# Patient Record
Sex: Male | Born: 1965 | Race: White | Hispanic: No | Marital: Married | Smoking: Current every day smoker
Health system: Southern US, Community
[De-identification: ages and names within clinical notes are randomized; demographics above are authoritative.]

---

## 2018-04-28 ENCOUNTER — Emergency Department (HOSPITAL_BASED_OUTPATIENT_CLINIC_OR_DEPARTMENT_OTHER): Payer: Self-pay

## 2018-04-28 ENCOUNTER — Other Ambulatory Visit: Payer: Self-pay

## 2018-04-28 ENCOUNTER — Encounter (HOSPITAL_BASED_OUTPATIENT_CLINIC_OR_DEPARTMENT_OTHER): Payer: Self-pay | Admitting: *Deleted

## 2018-04-28 ENCOUNTER — Emergency Department (HOSPITAL_BASED_OUTPATIENT_CLINIC_OR_DEPARTMENT_OTHER)
Admission: EM | Admit: 2018-04-28 | Discharge: 2018-04-28 | Disposition: A | Discharge status: Home / Self Care | Payer: Self-pay | Attending: Emergency Medicine | Admitting: Emergency Medicine

## 2018-04-28 DIAGNOSIS — Y92002 Bathroom of unspecified non-institutional (private) residence single-family (private) house as the place of occurrence of the external cause: Secondary | ICD-10-CM | POA: Insufficient documentation

## 2018-04-28 DIAGNOSIS — Y999 Unspecified external cause status: Secondary | ICD-10-CM | POA: Insufficient documentation

## 2018-04-28 DIAGNOSIS — Y939 Activity, unspecified: Secondary | ICD-10-CM | POA: Insufficient documentation

## 2018-04-28 DIAGNOSIS — M62830 Muscle spasm of back: Secondary | ICD-10-CM

## 2018-04-28 DIAGNOSIS — J069 Acute upper respiratory infection, unspecified: Secondary | ICD-10-CM

## 2018-04-28 DIAGNOSIS — S300XXA Contusion of lower back and pelvis, initial encounter: Secondary | ICD-10-CM

## 2018-04-28 DIAGNOSIS — F17210 Nicotine dependence, cigarettes, uncomplicated: Secondary | ICD-10-CM | POA: Insufficient documentation

## 2018-04-28 DIAGNOSIS — W0110XA Fall on same level from slipping, tripping and stumbling with subsequent striking against unspecified object, initial encounter: Secondary | ICD-10-CM | POA: Insufficient documentation

## 2018-04-28 DIAGNOSIS — B9789 Other viral agents as the cause of diseases classified elsewhere: Secondary | ICD-10-CM | POA: Insufficient documentation

## 2018-04-28 MED ORDER — CHLORZOXAZONE 250 MG PO TABS: 250.0000 mg | ORAL_TABLET | Freq: Three times a day (TID) | ORAL | 0 refills | Status: AC | PRN

## 2018-04-28 MED ORDER — DIAZEPAM 5 MG PO TABS
10.0000 mg | ORAL_TABLET | Freq: Once | ORAL | Status: AC
  Administered 2018-04-28: 10 mg via ORAL
  Filled 2018-04-28: qty 2

## 2018-04-28 MED ORDER — TRAMADOL HCL 50 MG PO TABS: 50.0000 mg | ORAL_TABLET | Freq: Four times a day (QID) | ORAL | 0 refills | Status: AC | PRN

## 2018-04-28 MED ORDER — KETOROLAC TROMETHAMINE 15 MG/ML IJ SOLN
15.0000 mg | Freq: Once | INTRAMUSCULAR | Status: DC
  Filled 2018-04-28: qty 1

## 2018-04-28 NOTE — ED Notes (Signed)
Pt refused Toradol. States he doesn't like shots. Pt states his ride is coming to drive him home.

## 2018-04-28 NOTE — ED Notes (Signed)
Pts wife called requesting we draw blood and do chest xray, informed wife I could not give her any information at this time

## 2018-04-28 NOTE — ED Provider Notes (Signed)
[facility] EMERGENCY DEPARTMENT Provider Note   CSN: [id number] Arrival date & time: 04/28/18  1415    History   Chief Complaint Chief Complaint  Patient presents with  . URI    HPI Allen Humphrey is a 53 y.o. male.     Patient is a 53 year old male who presents to the emergency department with a complaint of back pain as well as upper respiratory symptoms.  The patient states that he fell in the bathroom on yesterday February 16 and injured his lower back.  He says that today he attempted to go to work, but could not because of the severity of the pain.  He is not had any changes in his stool, no changes in his urine.  He has pain with walking, but is able to walk.  He is not had any additional falls since this initial reported fall.  The patient also reports recent upper respiratory symptoms for the past week.  Including nasal congestion.  He is not had any nosebleed.  He has had some scratchiness of his throat.  He is also had some cough noted.  He is not sure of temperature elevation.  He request that this be evaluated as well.  The history is provided by the patient.  URI  Presenting symptoms: congestion and cough   Associated symptoms: myalgias   Associated symptoms: no arthralgias, no neck pain and no wheezing     History reviewed. No pertinent past medical history.  There are no active problems to display for this patient.   History reviewed. No pertinent surgical history.      Home Medications    Prior to Admission medications   Not on File    Family History History reviewed. No pertinent family history.  Social History Social History   Tobacco Use  . Smoking status: Current Every Day Smoker    Packs/day: 0.50    Types: Cigarettes  . Smokeless tobacco: Never Used  Substance Use Topics  . Alcohol use: Not Currently  . Drug use: Not Currently     Allergies   Patient has no known allergies.   Review of Systems Review of Systems    Constitutional: Negative for activity change.       All ROS Neg except as noted in HPI  HENT: Positive for congestion and postnasal drip. Negative for nosebleeds.   Eyes: Negative for photophobia and discharge.  Respiratory: Positive for cough. Negative for shortness of breath and wheezing.   Cardiovascular: Negative for chest pain and palpitations.  Gastrointestinal: Negative for abdominal pain and blood in stool.  Genitourinary: Negative for dysuria, frequency and hematuria.  Musculoskeletal: Positive for back pain and myalgias. Negative for arthralgias and neck pain.  Skin: Negative.   Neurological: Negative for dizziness, seizures and speech difficulty.  Psychiatric/Behavioral: Negative for confusion and hallucinations.     Physical Exam Updated Vital Signs BP (!) 148/94 (BP Location: Left Arm)   Pulse 80   Temp 97.8 F (36.6 C)   Resp 18   Ht 6' (1.829 m)   Wt 77.1 kg   SpO2 100%   BMI 23.06 kg/m   Physical Exam Vitals signs and nursing note reviewed.  Constitutional:      General: He is not in acute distress.    Appearance: He is well-developed. He is not toxic-appearing.  HENT:     Head: Normocephalic and atraumatic.     Right Ear: Tympanic membrane and external ear normal.     Left Ear:  Tympanic membrane and external ear normal.     Nose: Congestion present.  Eyes:     General: Lids are normal. No scleral icterus.       Right eye: No discharge.        Left eye: No discharge.     Conjunctiva/sclera: Conjunctivae normal.     Pupils: Pupils are equal, round, and reactive to light.  Neck:     Musculoskeletal: Normal range of motion and neck supple.     Vascular: No carotid bruit.     Trachea: No tracheal deviation.  Cardiovascular:     Rate and Rhythm: Normal rate and regular rhythm.     Pulses: Normal pulses.     Heart sounds: Normal heart sounds.  Pulmonary:     Effort: Pulmonary effort is normal. No respiratory distress.     Breath sounds: Normal breath  sounds. No stridor. No wheezing or rales.  Abdominal:     General: Bowel sounds are normal. There is no distension.     Palpations: Abdomen is soft.     Tenderness: There is no abdominal tenderness. There is no guarding or rebound.  Musculoskeletal: Normal range of motion.        General: No tenderness.     Lumbar back: He exhibits pain and spasm.       Back:     Comments: There is pain and spasm of the lumbar area.  Particularly spasm in the paraspinal area in the lumbar region.  There is no palpable step-off of the thoracic or the lumbar area.  Lymphadenopathy:     Head:     Right side of head: No submandibular adenopathy.     Left side of head: No submandibular adenopathy.     Cervical: No cervical adenopathy.  Skin:    General: Skin is warm and dry.     Findings: No rash.  Neurological:     Mental Status: He is alert and oriented to person, place, and time.     Cranial Nerves: No cranial nerve deficit (no facial droop, extraocular movements intact, no slurred speech).     Sensory: No sensory deficit.     Motor: No abnormal muscle tone or seizure activity.     Coordination: Coordination normal.     Comments: Gait steady. No foot drop. No difficulty with balance.  Psychiatric:        Speech: Speech normal.      ED Treatments / Results  Labs (all labs ordered are listed, but only abnormal results are displayed) Labs Reviewed - No data to display  EKG None  Radiology Dg Chest 2 View  Result Date: 04/28/2018 CLINICAL DATA:  Cough and congestion with fever EXAM: CHEST - 2 VIEW COMPARISON:  None. FINDINGS: Lungs are clear. Heart size and pulmonary vascularity are normal. No adenopathy. There is bony overgrowth along each anterior first rib. There is mild degenerative change in the midthoracic spine. IMPRESSION: No edema or consolidation. Electronically Signed   By: Bretta Bang III M.D.   On: 04/28/2018 14:51    Procedures Procedures (including critical care  time)  Medications Ordered in ED Medications - No data to display   Initial Impression / Assessment and Plan / ED Course  I have reviewed the triage vital signs and the nursing notes.  Pertinent labs & imaging results that were available during my care of the patient were reviewed by me and considered in my medical decision making (see chart for details).  Final Clinical Impressions(s) / ED Diagnoses MDM  The blood pressure was slightly elevated at 148/94, otherwise vital signs were within normal limits.  Chest x-ray was obtained and there was no evidence of any pneumonia or other acute problem.  No gross neurologic deficits noted particularly involving the lower extremities.  The gait was intact.  There is no foot drop appreciated.  And no neurovascular deficits of the lower extremities.  X-ray of the lumbar spine shows some mild loss of disc height at the L2-L3 area with moderate disc loss at the L5-S1 area there is also noted some facet joint narrowing bilaterally at the L2-L3 through L5-S1 area these were nonacute findings.  There was no fracture noted.  There was also noted some straightening of the lumbar lordotic curve.  I discussed with the patient the degenerative changes of his back I also discussed with him the spasm changes.  The patient was given Valium earlier.  The patient stated that he did not want any more the Valium because it "made him feel bad".  The patient refused the intramuscular Toradol that was offered, stating that he does not like needles.  At the time of discharge the patient was given a prescription for Parafon forte and Ultram.  The patient had previously requested that he be given Percocets for his pain.  I discussed with the patient that we were observing the guidelines from the STOP ACT.  When the nurse was discharging the patient with the Parafon forte and the Ultram.  The patient refused the prescription, and said to the nurse that if he was  not going to receive the Percocet that we could take this SHIT and keep it.  Patient left without his instructions or prescriptions.   Final diagnoses:  Lumbar contusion, initial encounter  Lumbar paraspinal muscle spasm  Viral upper respiratory tract infection    ED Discharge Orders    None       Ivery QualeBryant, Klaire Court, PA-C 04/28/18 1803    Arby BarrettePfeiffer, Marcy, MD 04/28/18 2340

## 2018-04-28 NOTE — ED Notes (Signed)
Pt refused to sign for d/c papers

## 2018-04-28 NOTE — ED Triage Notes (Signed)
Pt c/o URi symptoms  X  1 week

## 2018-04-28 NOTE — ED Notes (Signed)
Per PA, pt given drink.

## 2018-04-28 NOTE — ED Notes (Signed)
Upon discharge pt refused prescription for tramadol and parafon forte, pt requesting Percocet and valium , pa aware , no new orders , pt states "if that's all your giving me then ive wasted my time "  [facility] notified that pt received valium here in ed and is unable to drive

## 2018-04-28 NOTE — Discharge Instructions (Addendum)
Your blood pressure is slightly elevated, otherwise your vital signs are within normal limits.  Your oxygen level is 100% on room air.  Which is within normal limits.  Your chest x-ray is negative for pneumonia, or other emergent cardiopulmonary issues.  The examination of your back reveals spasm in the lower back area.  The x-ray of your lower back shows chronic changes of your disc areas from L2 down to L5.  There is evidence of spasm on your back films.  There is no fracture, and there is no dislocation.  Please rest her back is much as possible.  Use a heating pad to the area.  Use Parafon forte 3 times daily for spasm.  Use 1000 mg of Tylenol every 4 hours, or 600 mg of ibuprofen with breakfast, lunch, dinner, and at bedtime.  May use Ultram for more severe pain.  Parafon forte and Ultram may cause drowsiness, and/or lightheadedness.  Please do not drive a vehicle, operate machinery, drink alcohol, or participate in activities requiring concentration when taking either these medications.  Please follow-up with your primary physician concerning these issues.

## 2019-06-24 IMAGING — CR DG CHEST 2V
2 series · 2 of 2 positions shown · non-contrast
Comparison: None.

CLINICAL DATA: Cough and congestion with fever

EXAM:
CHEST - 2 VIEW

[w chest pa]
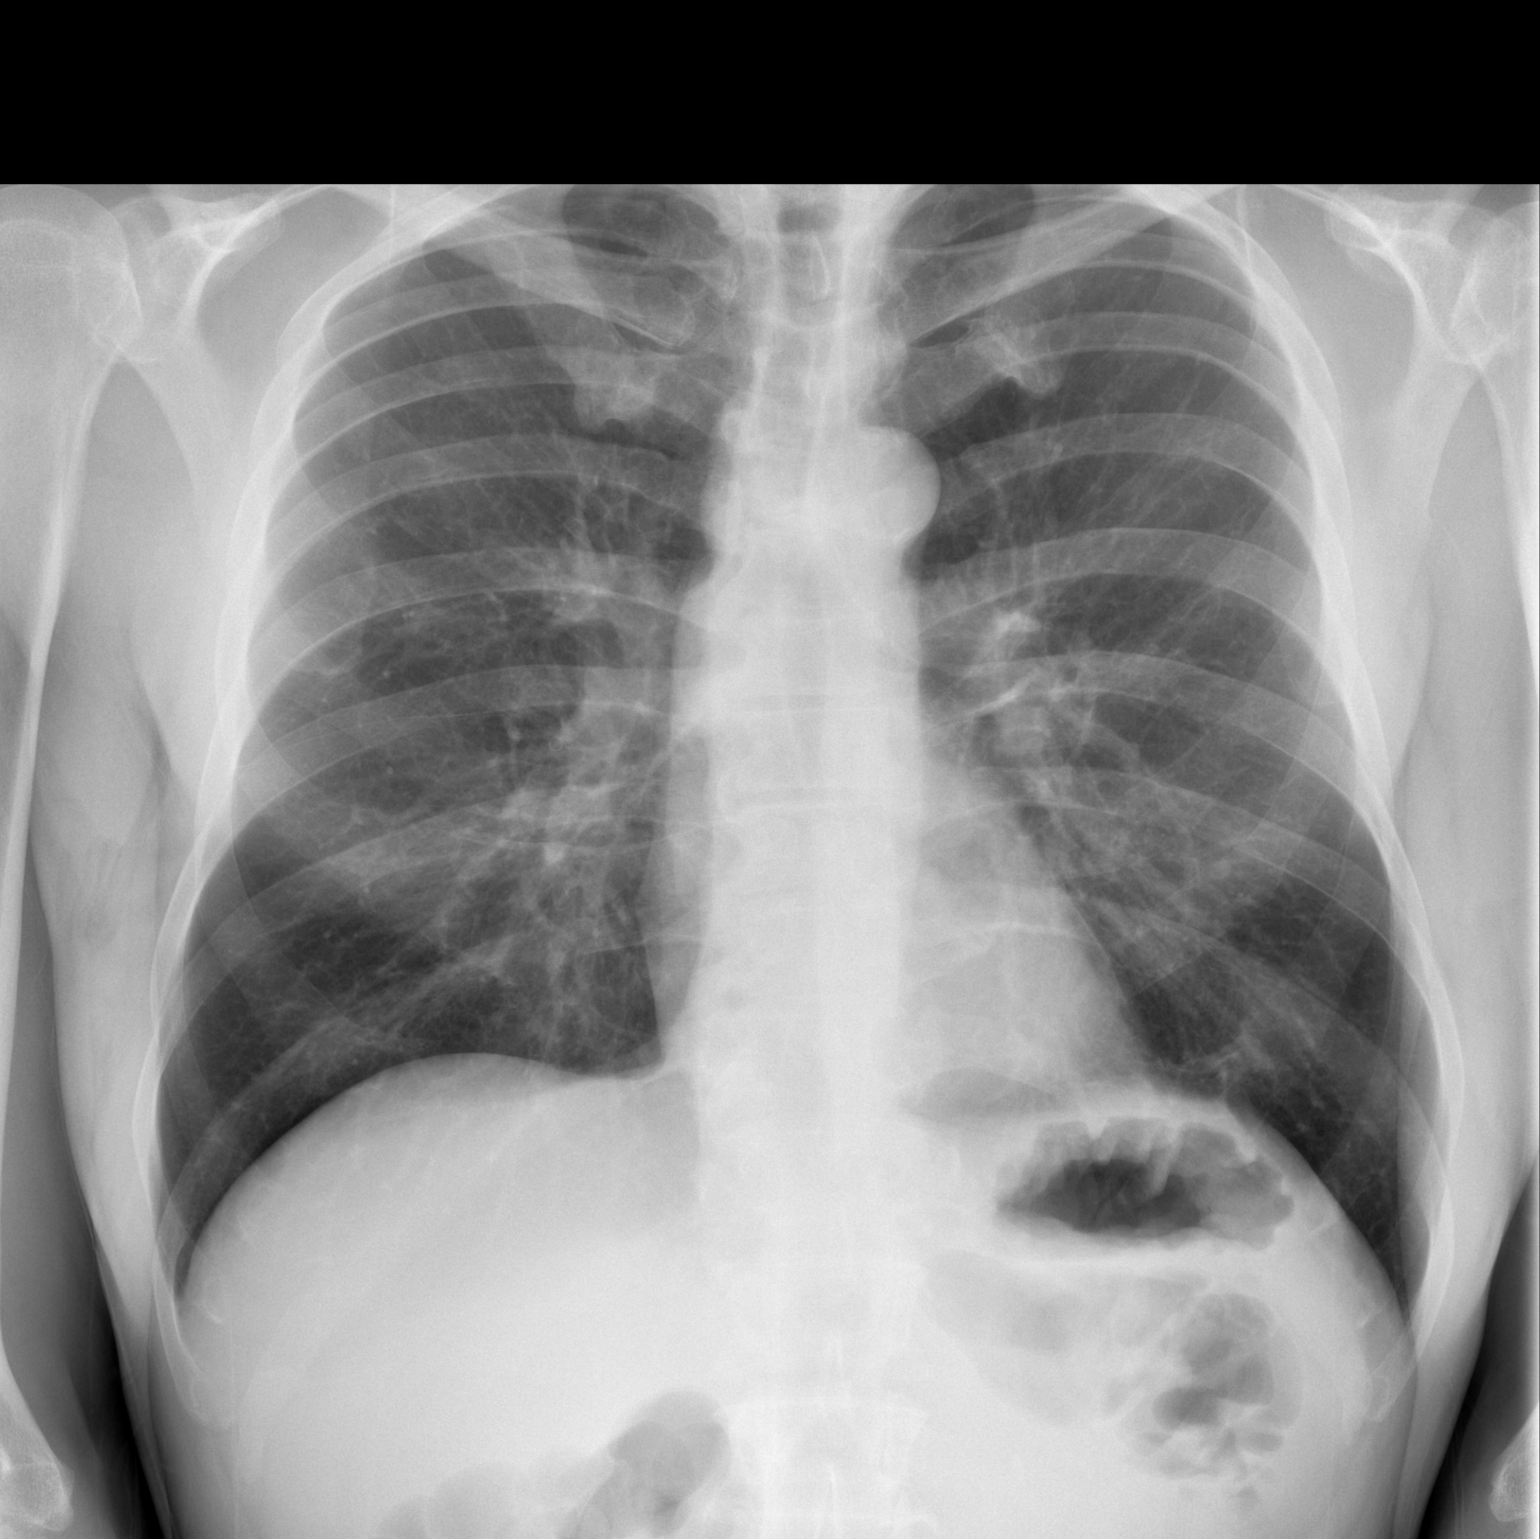

[w chest lat]
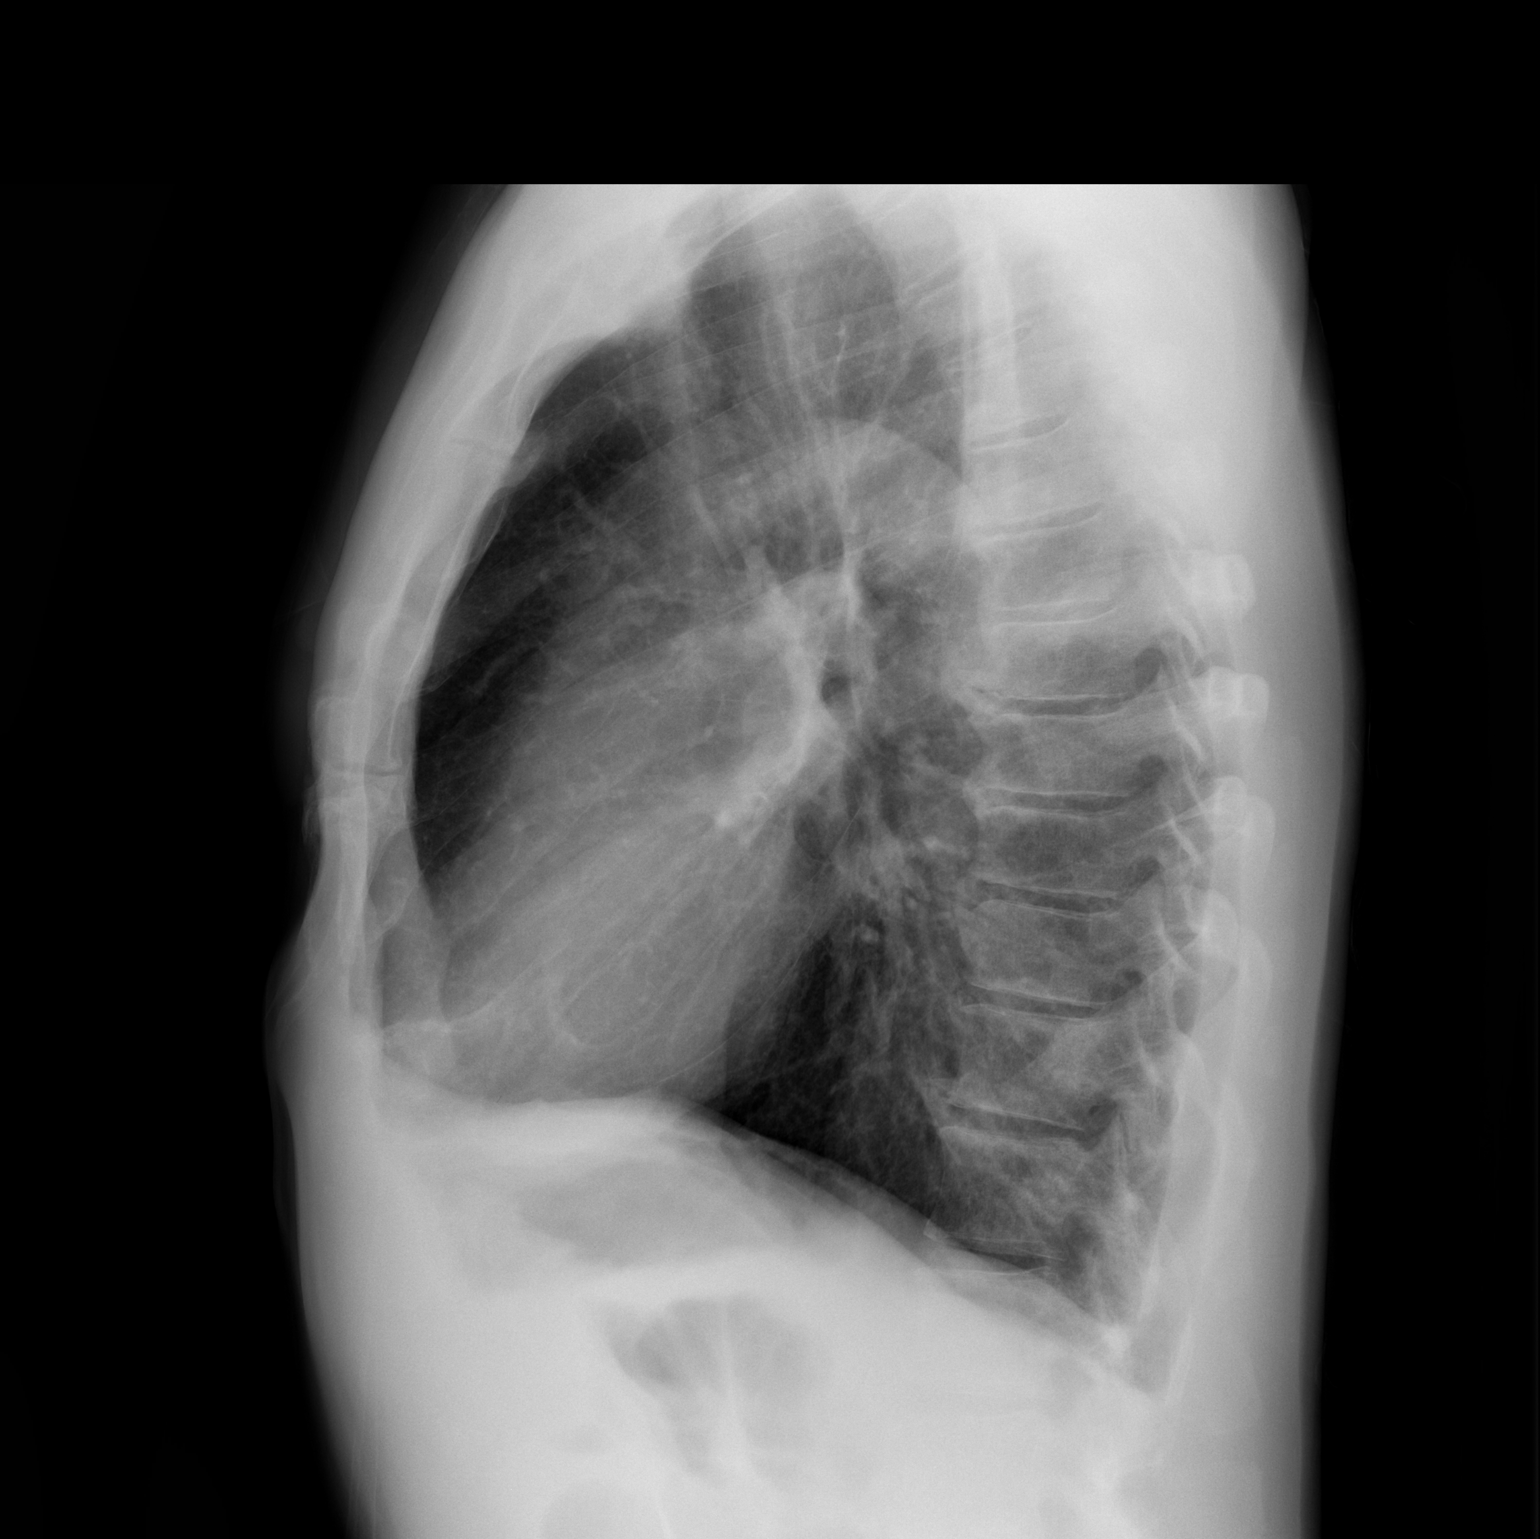

[2 of 2 positions shown; findings below may reference images not displayed]

FINDINGS: Lungs are clear. Heart size and pulmonary vascularity are normal. No
adenopathy. There is bony overgrowth along each anterior first rib.
There is mild degenerative change in the midthoracic spine.
IMPRESSION: No edema or consolidation.
